# Patient Record
Sex: Female | Born: 1975 | Hispanic: Yes | Marital: Married | Smoking: Never smoker
Health system: Southern US, Community
[De-identification: ages and names within clinical notes are randomized; demographics above are authoritative.]

## PROBLEM LIST (undated history)

## (undated) DIAGNOSIS — O24419 Gestational diabetes mellitus in pregnancy, unspecified control: Secondary | ICD-10-CM

---

## 2006-02-08 ENCOUNTER — Emergency Department: Payer: Self-pay | Admitting: Emergency Medicine

## 2006-05-01 ENCOUNTER — Emergency Department: Payer: Self-pay | Admitting: Emergency Medicine

## 2010-01-17 ENCOUNTER — Ambulatory Visit: Payer: Self-pay | Admitting: Family Medicine

## 2010-01-22 ENCOUNTER — Ambulatory Visit: Payer: Self-pay | Admitting: Family Medicine

## 2010-02-19 ENCOUNTER — Ambulatory Visit: Payer: Self-pay | Admitting: Family Medicine

## 2010-05-24 ENCOUNTER — Ambulatory Visit: Payer: Self-pay | Admitting: Family Medicine

## 2010-06-10 ENCOUNTER — Inpatient Hospital Stay: Payer: Self-pay | Admitting: Obstetrics and Gynecology

## 2010-07-22 ENCOUNTER — Ambulatory Visit: Payer: Self-pay | Admitting: Family Medicine

## 2010-08-20 ENCOUNTER — Ambulatory Visit: Payer: Self-pay | Admitting: Family Medicine

## 2014-04-21 NOTE — L&D Delivery Note (Signed)
VAGINAL DELIVERY NOTE:  Date of Delivery: 10/25/2014 Primary OB: [facility]  Gestational Age/EDD: 11/10/14. Antepartum complications: gestational diabetes Attending Physician:Jourdan Maldonado  Delivery Type: spontaneous vaginal delivery  Anesthesia: none Laceration: none Episiotomy: none Placenta: spontaneous Intrapartum complications: rapid delivery second stage  Estimated Blood Loss: 150cc GBS: negative Procedure Details: pt progressed from 5 cm to pushing within 1 hour . Bed delivery be me .   Baby: Liveborn female, Apgars 8/9, weight 2940 #, 6/7 oz, baby named    .

## 2014-05-23 LAB — OB RESULTS CONSOLE GC/CHLAMYDIA
Chlamydia: NEGATIVE
Gonorrhea: NEGATIVE

## 2014-05-23 LAB — OB RESULTS CONSOLE ABO/RH: RH Type: POSITIVE

## 2014-05-23 LAB — OB RESULTS CONSOLE HGB/HCT, BLOOD
HCT: 38 %
Hemoglobin: 12.2 g/dL

## 2014-05-23 LAB — OB RESULTS CONSOLE RUBELLA ANTIBODY, IGM: Rubella: IMMUNE

## 2014-05-23 LAB — OB RESULTS CONSOLE HIV ANTIBODY (ROUTINE TESTING): HIV: NONREACTIVE

## 2014-05-23 LAB — OB RESULTS CONSOLE RPR: RPR: NONREACTIVE

## 2014-05-23 LAB — OB RESULTS CONSOLE HEPATITIS B SURFACE ANTIGEN: HEP B S AG: NEGATIVE

## 2014-05-23 LAB — OB RESULTS CONSOLE ANTIBODY SCREEN: Antibody Screen: NEGATIVE

## 2014-05-23 LAB — OB RESULTS CONSOLE VARICELLA ZOSTER ANTIBODY, IGG: VARICELLA IGG: IMMUNE

## 2014-06-14 ENCOUNTER — Ambulatory Visit: Payer: Self-pay | Admitting: Physician Assistant

## 2014-06-19 ENCOUNTER — Ambulatory Visit: Payer: Self-pay | Admitting: Physician Assistant

## 2014-06-20 ENCOUNTER — Ambulatory Visit
Admit: 2014-06-20 | Disposition: A | Discharge status: Home / Self Care | Payer: Self-pay | Attending: Physician Assistant | Admitting: Physician Assistant

## 2014-07-21 ENCOUNTER — Ambulatory Visit
Admit: 2014-07-21 | Disposition: A | Discharge status: Home / Self Care | Payer: Self-pay | Attending: Physician Assistant | Admitting: Physician Assistant

## 2014-10-14 LAB — OB RESULTS CONSOLE GBS: GBS: NEGATIVE

## 2014-10-25 ENCOUNTER — Encounter: Payer: Self-pay | Admitting: *Deleted

## 2014-10-25 ENCOUNTER — Inpatient Hospital Stay
Admission: EM | Admit: 2014-10-25 | Discharge: 2014-10-27 | DRG: 775 | Disposition: A | Discharge status: Home / Self Care | Payer: Medicaid Other | Attending: Obstetrics and Gynecology | Admitting: Obstetrics and Gynecology

## 2014-10-25 DIAGNOSIS — O24410 Gestational diabetes mellitus in pregnancy, diet controlled: Secondary | ICD-10-CM | POA: Diagnosis present

## 2014-10-25 DIAGNOSIS — R109 Unspecified abdominal pain: Secondary | ICD-10-CM | POA: Diagnosis present

## 2014-10-25 DIAGNOSIS — O24420 Gestational diabetes mellitus in childbirth, diet controlled: Secondary | ICD-10-CM | POA: Diagnosis present

## 2014-10-25 DIAGNOSIS — Z3A39 39 weeks gestation of pregnancy: Secondary | ICD-10-CM | POA: Diagnosis present

## 2014-10-25 HISTORY — DX: Gestational diabetes mellitus in pregnancy, unspecified control: O24.419

## 2014-10-25 LAB — TYPE AND SCREEN
ABO/RH(D): O POS
Antibody Screen: NEGATIVE

## 2014-10-25 LAB — CBC
HEMATOCRIT: 41.1 % (ref 35.0–47.0)
HEMOGLOBIN: 13.1 g/dL (ref 12.0–16.0)
MCH: 24.5 pg — ABNORMAL LOW (ref 26.0–34.0)
MCHC: 31.9 g/dL — AB (ref 32.0–36.0)
MCV: 76.8 fL — AB (ref 80.0–100.0)
Platelets: 228 10*3/uL (ref 150–440)
RBC: 5.35 MIL/uL — ABNORMAL HIGH (ref 3.80–5.20)
RDW: 16 % — AB (ref 11.5–14.5)
WBC: 14.7 10*3/uL — ABNORMAL HIGH (ref 3.6–11.0)

## 2014-10-25 MED ORDER — OXYTOCIN 40 UNITS IN LACTATED RINGERS INFUSION - SIMPLE MED
INTRAVENOUS | Status: AC
  Administered 2014-10-26: 01:00:00 via INTRAVENOUS
  Filled 2014-10-25: qty 1000

## 2014-10-25 MED ORDER — OXYCODONE-ACETAMINOPHEN 5-325 MG PO TABS: 1.0000 | ORAL_TABLET | ORAL | Status: DC | PRN

## 2014-10-25 MED ORDER — OXYCODONE-ACETAMINOPHEN 5-325 MG PO TABS: 2.0000 | ORAL_TABLET | ORAL | Status: DC | PRN

## 2014-10-25 MED ORDER — CITRIC ACID-SODIUM CITRATE 334-500 MG/5ML PO SOLN: 30.0000 mL | ORAL | Status: DC | PRN

## 2014-10-25 MED ORDER — LIDOCAINE HCL (PF) 1 % IJ SOLN
30.0000 mL | INTRAMUSCULAR | Status: DC | PRN
  Filled 2014-10-25: qty 30

## 2014-10-25 MED ORDER — IBUPROFEN 600 MG PO TABS
ORAL_TABLET | ORAL | Status: AC
  Administered 2014-10-25: 600 mg via ORAL
  Filled 2014-10-25: qty 1

## 2014-10-25 MED ORDER — OXYTOCIN 10 UNIT/ML IJ SOLN
INTRAMUSCULAR | Status: AC
  Filled 2014-10-25: qty 2

## 2014-10-25 MED ORDER — ACETAMINOPHEN 325 MG PO TABS: 650.0000 mg | ORAL_TABLET | ORAL | Status: DC | PRN

## 2014-10-25 MED ORDER — OXYTOCIN 40 UNITS IN LACTATED RINGERS INFUSION - SIMPLE MED
62.5000 mL/h | INTRAVENOUS | Status: DC
  Administered 2014-10-25: 62.5 mL/h via INTRAVENOUS

## 2014-10-25 MED ORDER — LACTATED RINGERS IV SOLN: 500.0000 mL | INTRAVENOUS | Status: DC | PRN

## 2014-10-25 MED ORDER — OXYTOCIN BOLUS FROM INFUSION: 500.0000 mL | INTRAVENOUS | Status: DC

## 2014-10-25 MED ORDER — ONDANSETRON HCL 4 MG/2ML IJ SOLN
4.0000 mg | Freq: Four times a day (QID) | INTRAMUSCULAR | Status: DC | PRN
Start: 2014-10-25 — End: 2014-10-26

## 2014-10-25 MED ORDER — LACTATED RINGERS IV SOLN: INTRAVENOUS | Status: DC

## 2014-10-25 NOTE — H&P (Signed)
Tanya Hartman is a 39 y.o. female presenting for regular ctx . Maternal Medical History:  Reason for admission: Contractions.   Contractions: Onset was 6-12 hours ago.   Frequency: regular.    Fetal activity: Perceived fetal activity is normal.    Prenatal Complications - Diabetes: Diabetes is managed by diet.      OB History    Gravida Para Term Preterm AB TAB SAB Ectopic Multiple Living   6 3 3  0 1 0 1 0 0 3      Obstetric Comments   All Vaginal births at term. No complications with pregnancy, delivery or postpartum for Mom or babies as per pt.  Weights, 6lbs, 8 lbs, 9 lbs. Last appt stated baby this time about 8 lbs. Last labor approx 8-9 hours.IV pain medical management with other  deliveries. Pt reports she would like to try IV pain med. Stases with last 2 and this pregnancy she has been dx with Gestational diabetes- controlled with diet. Tests CBG 4x times a day- states normal results     Past Medical History  Diagnosis Date  . Gestational diabetes     with this preg and last 2, diet controlled   History reviewed. No pertinent past surgical history. Family History: family history is not on file. Social History:  reports that she has never smoked. She has never used smokeless tobacco. She reports that she does not drink alcohol or use illicit drugs.   Prenatal Transfer Tool  Maternal Diabetes: No Genetic Screening: Declined Maternal Ultrasounds/Referrals: Normal Fetal Ultrasounds or other Referrals:  None Maternal Substance Abuse:  Significant Maternal Medications:  None Significant Maternal Lab Results:  None Other Comments:  None  ROS  Dilation: 5 Effacement (%): 100 Station: -1 Exam by:: Orie FishermanM Taylor RN There were no vitals taken for this visit. Exam Physical Exam  Lungs cta  CV rrr without murmur adb gravid  Prenatal labs: ABO, Rh: O/Positive/-- (02/02 0000) Antibody: Negative (02/02 0000) Rubella: Immune (02/02 0000) RPR: Nonreactive (02/02  0000)  HBsAg: Negative (02/02 0000)  HIV: Non-reactive (02/02 0000)  GBS: Negative (06/25 0000)   Assessment/Plan: Labor  Anticipate svd   SCHERMERHORN,THOMAS 10/25/2014, 9:34 PM

## 2014-10-26 LAB — CBC
HCT: 38.3 % (ref 35.0–47.0)
Hemoglobin: 12 g/dL (ref 12.0–16.0)
MCH: 24 pg — AB (ref 26.0–34.0)
MCHC: 31.5 g/dL — AB (ref 32.0–36.0)
MCV: 76.4 fL — ABNORMAL LOW (ref 80.0–100.0)
Platelets: 210 10*3/uL (ref 150–440)
RBC: 5.01 MIL/uL (ref 3.80–5.20)
RDW: 16.3 % — AB (ref 11.5–14.5)
WBC: 15.7 10*3/uL — ABNORMAL HIGH (ref 3.6–11.0)

## 2014-10-26 LAB — GLUCOSE, CAPILLARY: GLUCOSE-CAPILLARY: 86 mg/dL (ref 65–99)

## 2014-10-26 MED ORDER — DIPHENHYDRAMINE HCL 25 MG PO CAPS: 25.0000 mg | ORAL_CAPSULE | Freq: Four times a day (QID) | ORAL | Status: DC | PRN

## 2014-10-26 MED ORDER — LANOLIN HYDROUS EX OINT: TOPICAL_OINTMENT | CUTANEOUS | Status: DC | PRN

## 2014-10-26 MED ORDER — DIBUCAINE 1 % RE OINT: 1.0000 "application " | TOPICAL_OINTMENT | RECTAL | Status: DC | PRN

## 2014-10-26 MED ORDER — OXYTOCIN 10 UNIT/ML IJ SOLN
10.0000 [IU] | Freq: Once | INTRAMUSCULAR | Status: AC
  Administered 2014-10-25: 10 [IU] via INTRAMUSCULAR

## 2014-10-26 MED ORDER — SIMETHICONE 80 MG PO CHEW: 80.0000 mg | CHEWABLE_TABLET | ORAL | Status: DC | PRN

## 2014-10-26 MED ORDER — MAGNESIUM HYDROXIDE 400 MG/5ML PO SUSP: 30.0000 mL | ORAL | Status: DC | PRN

## 2014-10-26 MED ORDER — ONDANSETRON HCL 4 MG/2ML IJ SOLN: 4.0000 mg | INTRAMUSCULAR | Status: DC | PRN

## 2014-10-26 MED ORDER — PRENATAL MULTIVITAMIN CH: 1.0000 | ORAL_TABLET | Freq: Every day | ORAL | Status: DC

## 2014-10-26 MED ORDER — ZOLPIDEM TARTRATE 5 MG PO TABS: 5.0000 mg | ORAL_TABLET | Freq: Every evening | ORAL | Status: DC | PRN

## 2014-10-26 MED ORDER — FERROUS SULFATE 325 (65 FE) MG PO TABS
325.0000 mg | ORAL_TABLET | Freq: Two times a day (BID) | ORAL | Status: DC
  Administered 2014-10-26 – 2014-10-27 (×3): 325 mg via ORAL
  Filled 2014-10-26 (×3): qty 1

## 2014-10-26 MED ORDER — BENZOCAINE-MENTHOL 20-0.5 % EX AERO: 1.0000 "application " | INHALATION_SPRAY | CUTANEOUS | Status: DC | PRN

## 2014-10-26 MED ORDER — ACETAMINOPHEN 325 MG PO TABS: 650.0000 mg | ORAL_TABLET | ORAL | Status: DC | PRN

## 2014-10-26 MED ORDER — SENNOSIDES-DOCUSATE SODIUM 8.6-50 MG PO TABS
2.0000 | ORAL_TABLET | ORAL | Status: DC
  Administered 2014-10-26 – 2014-10-27 (×2): 2 via ORAL
  Filled 2014-10-26 (×2): qty 2

## 2014-10-26 MED ORDER — WITCH HAZEL-GLYCERIN EX PADS: 1.0000 "application " | MEDICATED_PAD | CUTANEOUS | Status: DC | PRN

## 2014-10-26 MED ORDER — IBUPROFEN 600 MG PO TABS
600.0000 mg | ORAL_TABLET | Freq: Four times a day (QID) | ORAL | Status: DC
  Administered 2014-10-26 – 2014-10-27 (×6): 600 mg via ORAL
  Filled 2014-10-26 (×6): qty 1

## 2014-10-26 MED ORDER — OXYCODONE-ACETAMINOPHEN 5-325 MG PO TABS: 1.0000 | ORAL_TABLET | ORAL | Status: DC | PRN

## 2014-10-26 MED ORDER — ONDANSETRON HCL 4 MG PO TABS: 4.0000 mg | ORAL_TABLET | ORAL | Status: DC | PRN

## 2014-10-26 MED ORDER — MEASLES, MUMPS & RUBELLA VAC ~~LOC~~ INJ: 0.5000 mL | INJECTION | Freq: Once | SUBCUTANEOUS | Status: DC

## 2014-10-26 NOTE — Progress Notes (Signed)
Post Partum Day 1 Subjective: no complaints  Objective: Blood pressure 143/84, pulse 75, temperature 98.4 F (36.9 C), temperature source Oral, resp. rate 18, height 5\' 7"  (1.702 m), weight 160 lb (72.576 kg), SpO2 100 %, unknown if currently breastfeeding.  Physical Exam:  General: alert and cooperative Lochia: appropriate Uterine Fundus: firm Incision: none DVT Evaluation: No evidence of DVT seen on physical exam.  CV RRR Lungs CTA   Recent Labs  10/25/14 2204 10/26/14 0453  HGB 13.1 12.0  HCT 41.1 38.3    Assessment/Plan: Plan for discharge tomorrow   LOS: 1 day   Param Capri 10/26/2014, 8:55 AM

## 2014-10-27 LAB — RPR: RPR Ser Ql: NONREACTIVE

## 2014-10-27 MED ORDER — NORETHINDRONE 0.35 MG PO TABS: 1.0000 | ORAL_TABLET | Freq: Every day | ORAL | Status: DC

## 2014-10-27 MED ORDER — IBUPROFEN 600 MG PO TABS: 600.0000 mg | ORAL_TABLET | Freq: Four times a day (QID) | ORAL | Status: AC

## 2014-10-27 NOTE — Discharge Summary (Signed)
Obstetric Discharge Summary Reason for Admission: onset of labor Prenatal Procedures: none Intrapartum Procedures: spontaneous vaginal delivery Postpartum Procedures: none Complications-Operative and Postpartum: none HEMOGLOBIN  Date Value Ref Range Status  10/26/2014 12.0 12.0 - 16.0 g/dL Final  36/64/403402/05/2014 74.212.2 g/dL Final   HCT  Date Value Ref Range Status  10/26/2014 38.3 35.0 - 47.0 % Final  05/23/2014 38 % Final    Physical Exam:  General: alert and cooperative Lochia: appropriate Uterine Fundus: firm Incision: none DVT Evaluation: No evidence of DVT seen on physical exam. cv RRR , Lungs CTA Discharge Diagnoses: Term Pregnancy-delivered  Discharge Information: Date: 10/27/2014 Activity: pelvic rest Diet: routine Medications: Ibuprofen and micronor Condition: stable Instructions: refer to practice specific booklet Discharge to: home Follow-up Information    Follow up with [facility] In 6 weeks.   Specialty:  General Practice   Why:  postpartum care   Contact information:   [address] [place] [address] [phone]       Newborn Data: Live born female  Birth Weight: 6 lb 7.7 oz (2940 g) APGAR: 8, 9  Home with mother.  SCHERMERHORN,THOMAS 10/27/2014, 9:56 AM

## 2016-09-07 IMAGING — US US OB US >=[ID] SNGL FETUS
1 series · 13 of 28 positions shown · non-contrast
Comparison: none

CLINICAL DATA: Fetal anatomy evaluation

EXAM:
ULTRASOUND OB >=CN90R SINGLE FETUS

[Series 1: us ob us >=(id) sngl fetus · 0.26mm/px · 13 of 83 slices shown]
[im 4/83]
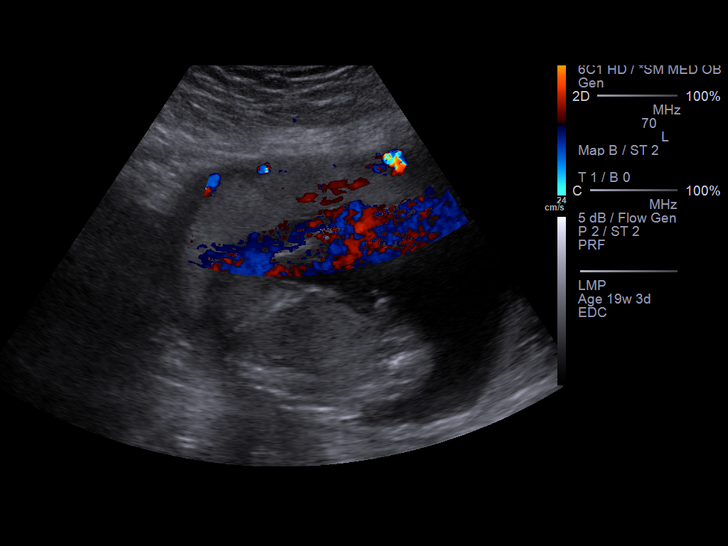
[im 10/83]
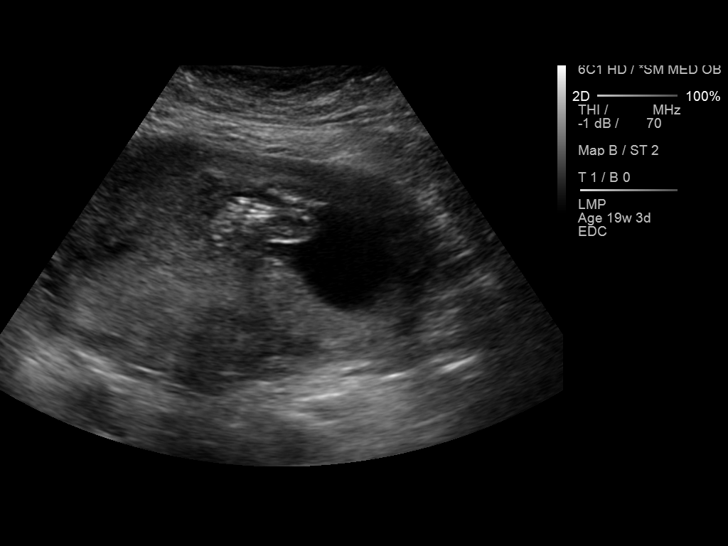
[im 16/83]
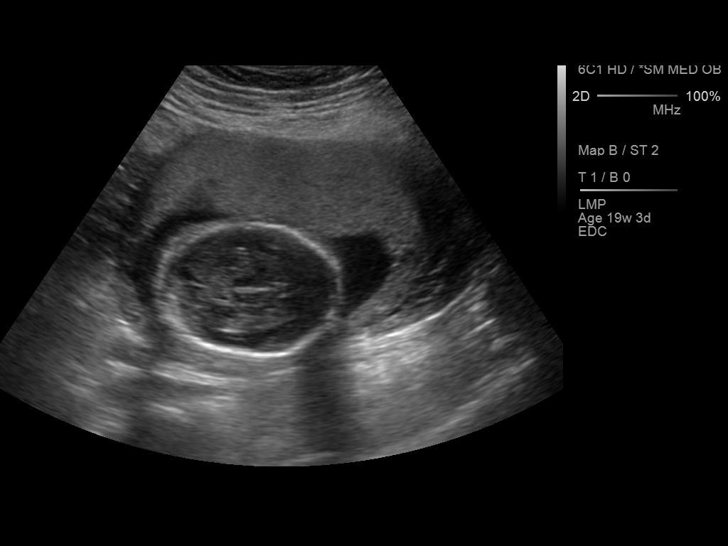
[im 22/83]
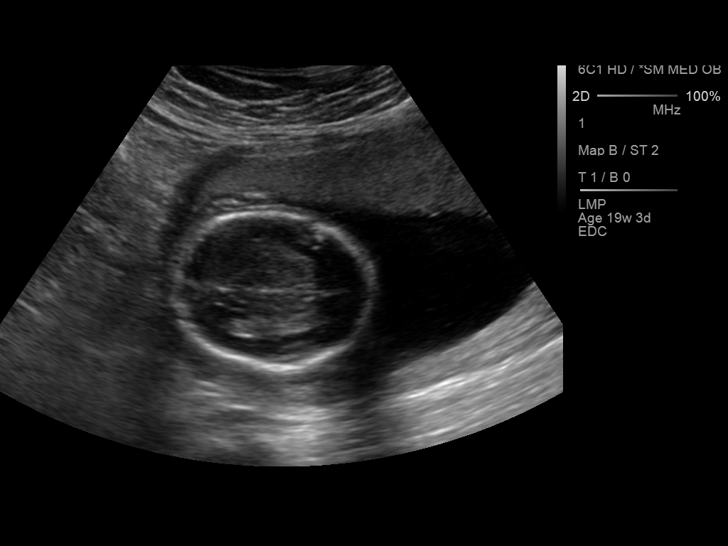
[im 28/83]
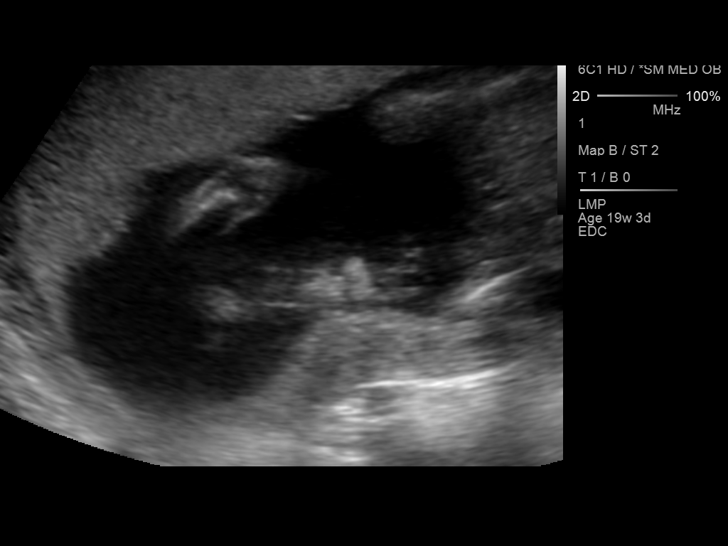
[im 34/83]
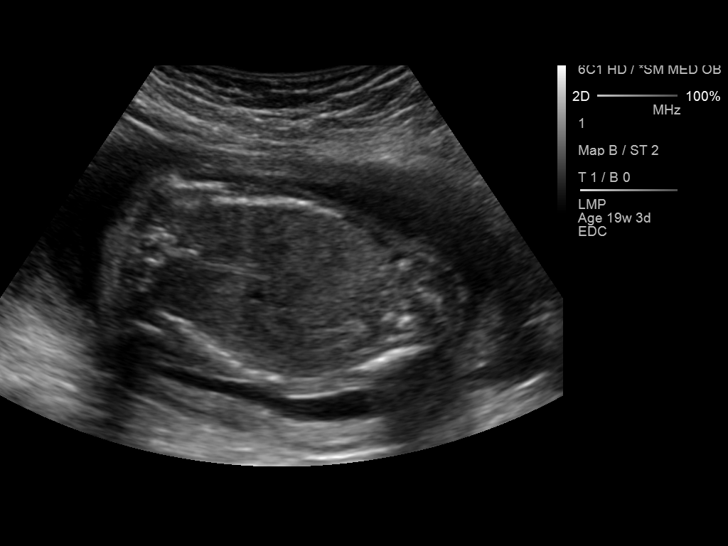
[im 43/83]
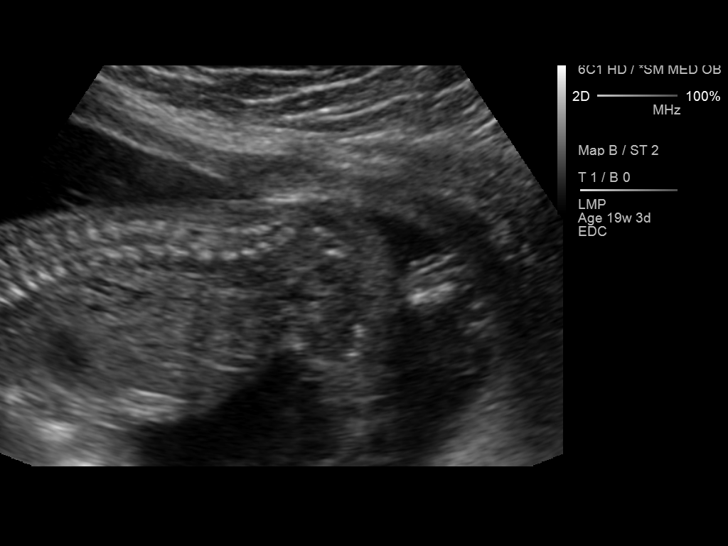
[im 49/83]
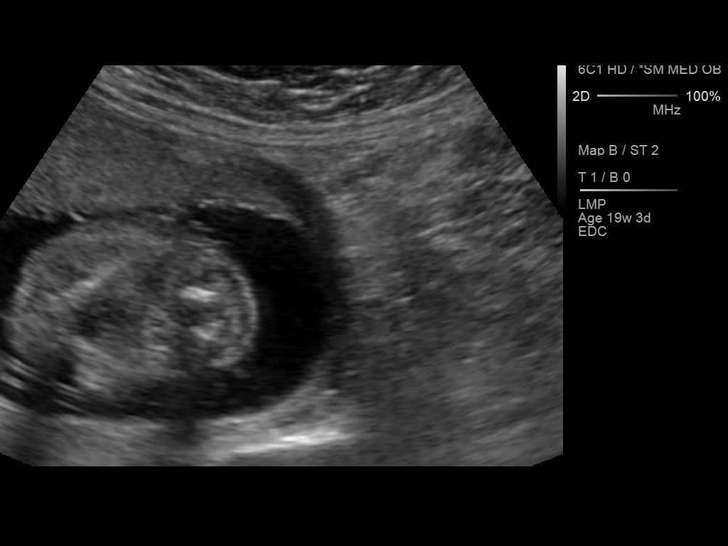
[im 55/83]
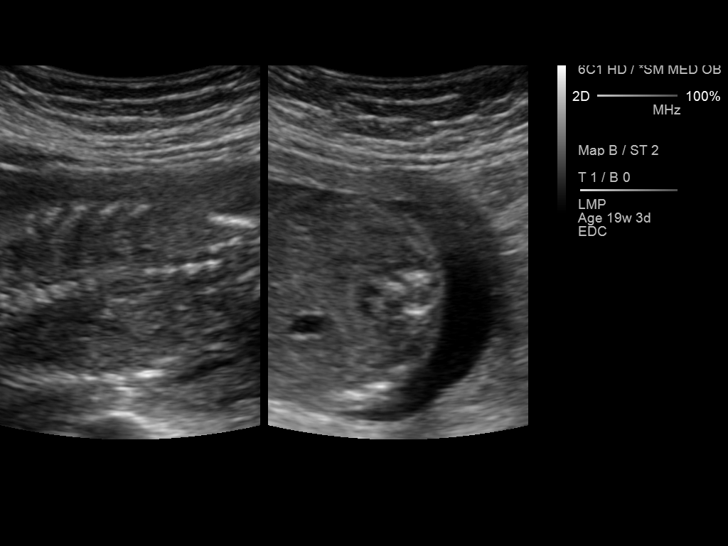
[im 61/83]
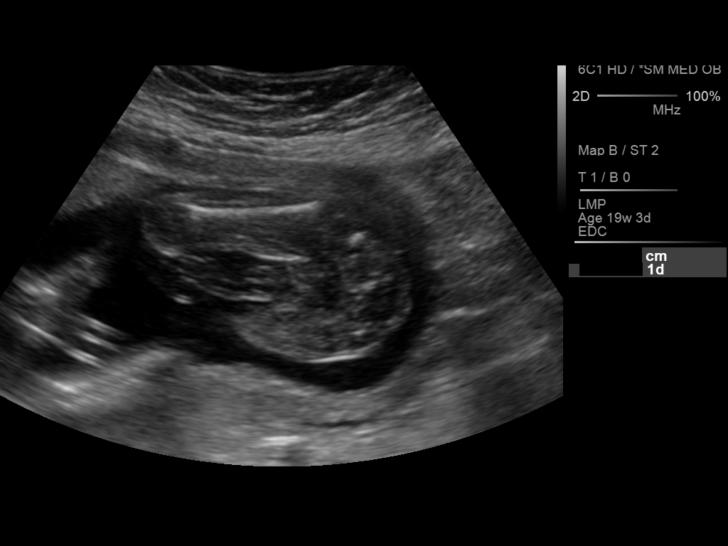
[im 67/83]
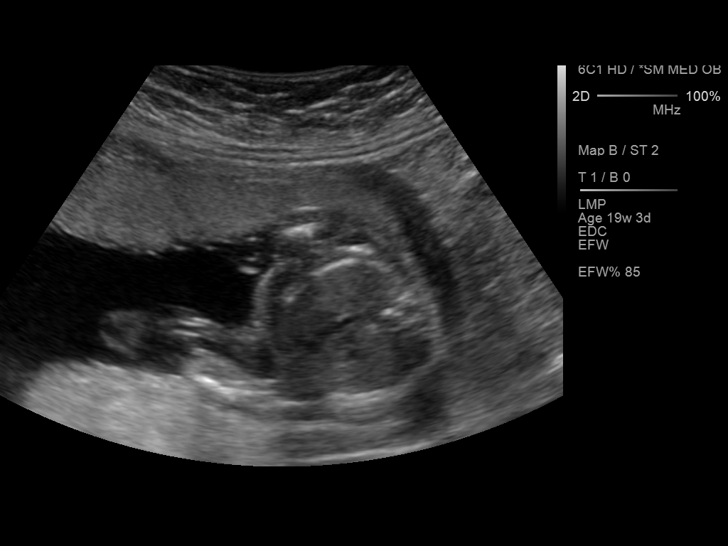
[im 73/83]
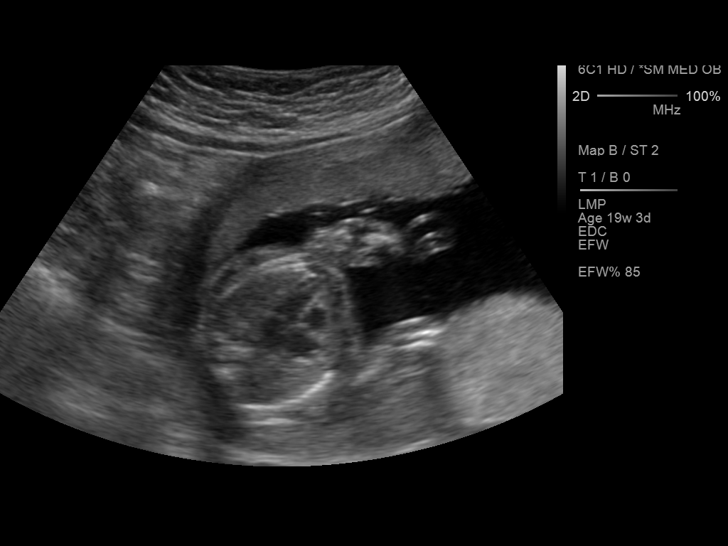
[im 79/83]
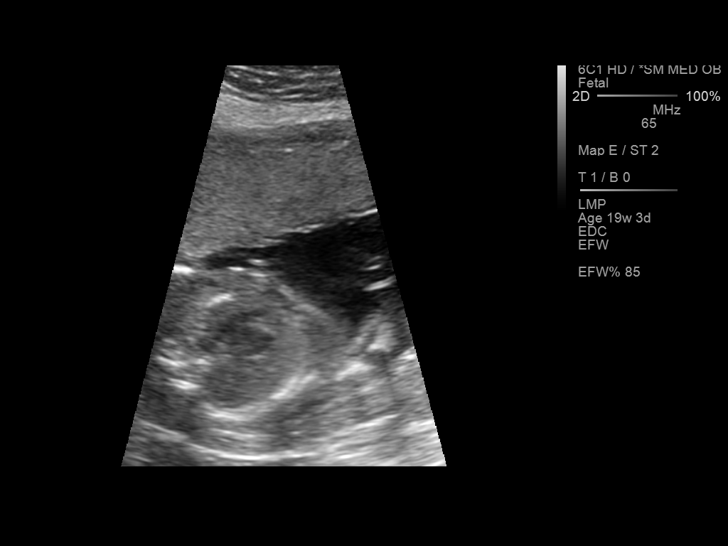

[13 of 28 positions shown; findings below may reference images not displayed]

FINDINGS: Number of Fetuses: 1

Heart Rate:  136 bpm

Movement: Yes

Presentation: Transverse

Previa: No

Placental Location: Anterior

Amniotic Fluid (Subjective): Normal

Amniotic Fluid (Objective):

Vertical pocket 5.6cm

FETAL BIOMETRY

BPD:  4.56cm 19w 5d

HC:    17.36cm  19w   6d

AC:   14.9cm  20w   1d

FL:   3.28cm  20w   2d

Current Mean GA: 20w 1d              US EDC: 11/05/2014

Estimated Fetal Weight:  336g    85%ile

FETAL ANATOMY

Lateral Ventricles: Appears normal

Thalami/CSP: Appears normal

Posterior Fossa:  Appears normal

Nuchal Region: Appears normal    NFT= 2.6mm

Upper Lip: Appears normal

Spine: Appears normal

4 Chamber Heart on Left: Appears normal

LVOT: Not well-visualized

RVOT: Not well visualized

Stomach on Left: Appears normal

3 Vessel Cord: Appears normal

Cord Insertion site: Appears normal

Kidneys: Appears normal

Bladder: Appears normal

Extremities: Appears normal

Sex: Male

Technically difficult due to: Fetal position

Maternal Findings:

Cervix:  Closed.  5.75 cm in length.
IMPRESSION: Single live intrauterine pregnancy as detailed above.

## 2018-10-08 ENCOUNTER — Other Ambulatory Visit: Payer: Self-pay

## 2018-10-08 ENCOUNTER — Encounter: Payer: Self-pay | Admitting: Emergency Medicine

## 2018-10-08 ENCOUNTER — Emergency Department
Admission: EM | Admit: 2018-10-08 | Discharge: 2018-10-08 | Disposition: A | Discharge status: Home / Self Care | Payer: HRSA Program | Attending: Emergency Medicine | Admitting: Emergency Medicine

## 2018-10-08 DIAGNOSIS — Z20828 Contact with and (suspected) exposure to other viral communicable diseases: Secondary | ICD-10-CM | POA: Diagnosis not present

## 2018-10-08 DIAGNOSIS — Z20822 Contact with and (suspected) exposure to covid-19: Secondary | ICD-10-CM

## 2018-10-08 NOTE — ED Triage Notes (Signed)
Says here to be tested for covid as she has been exposed.

## 2018-10-08 NOTE — ED Provider Notes (Signed)
[facility] Emergency Department Provider Note   ____________________________________________    I have reviewed the triage vital signs and the nursing notes.   HISTORY  Chief Complaint COVID test  Interpreter used   HPI Tanya Hartman is a 43 y.o. female here for COVID testing because she was exposed to someone that had COVID positive.  At this time she has no symptoms.  She feels well.  Past Medical History:  Diagnosis Date  . Gestational diabetes    with this preg and last 2, diet controlled    Patient Active Problem List   Diagnosis Date Noted  . Abdominal pain 10/25/2014    History reviewed. No pertinent surgical history.  Prior to Admission medications   Medication Sig Start Date End Date Taking? Authorizing Provider  ibuprofen (ADVIL,MOTRIN) 600 MG tablet Take 1 tablet (600 mg total) by mouth every 6 (six) hours. 10/27/14   Schermerhorn, Ihor Austinhomas J, MD  norethindrone (ORTHO MICRONOR) 0.35 MG tablet Take 1 tablet (0.35 mg total) by mouth daily. 10/27/14   Schermerhorn, Ihor Austinhomas J, MD  Prenatal Vit-Fe Fumarate-FA (PRENATAL MULTIVITAMIN) TABS tablet Take 1 tablet by mouth daily at 12 noon.    [provider]     Allergies Patient has no known allergies.  No family history on file.  Social History Social History   Tobacco Use  . Smoking status: Never Smoker  . Smokeless tobacco: Never Used  Substance Use Topics  . Alcohol use: No  . Drug use: No    Review of Systems  Constitutional: No fever/chills  ENT: No sore throat.      Musculoskeletal: Negative for myalgias Skin: Negative for rash. Neurological: Negative for headaches     ____________________________________________   PHYSICAL EXAM:  VITAL SIGNS: ED Triage Vitals  Enc Vitals Group     BP 10/08/18 1353 (!) 146/94     Pulse Rate 10/08/18 1353 77     Resp 10/08/18 1353 16     Temp 10/08/18 1353 99 F (37.2 C)     Temp Source 10/08/18 1353  Oral     SpO2 10/08/18 1353 99 %     Weight 10/08/18 1351 65.8 kg (145 lb)     Height --      Head Circumference --      Peak Flow --      Pain Score 10/08/18 1351 0     Pain Loc --      Pain Edu? --      Excl. in GC? --      Constitutional: Alert and oriented. No acute distress. Pleasant and interactive Eyes: Conjunctivae are normal.  Head: Atraumatic. Nose: No congestion/rhinnorhea. Mouth/Throat: Mucous membranes are moist.   Cardiovascular: Normal rate, regular rhythm.  Respiratory: Normal respiratory effort.  No retractions.  Musculoskeletal: No lower extremity tenderness nor edema.   Neurologic:No gross focal neurologic deficits are appreciated.   Skin:  Skin is warm, dry and intact. No rash noted.   ____________________________________________   LABS (all labs ordered are listed, but only abnormal results are displayed)  Labs Reviewed  NOVEL CORONAVIRUS, NAA (HOSPITAL ORDER, SEND-OUT TO REF LAB)   ____________________________________________  EKG   ____________________________________________  RADIOLOGY  None ____________________________________________   PROCEDURES  Procedure(s) performed: No  Procedures   Critical Care performed: No ____________________________________________   INITIAL IMPRESSION / ASSESSMENT AND PLAN / ED COURSE  Pertinent labs & imaging results that were available during my care of the patient were reviewed by me and  considered in my medical decision making (see chart for details).  COVID swab performed, sent out to reference lab   ____________________________________________   FINAL CLINICAL IMPRESSION(S) / ED DIAGNOSES  Final diagnoses:  Exposure to Covid-19 Virus      NEW MEDICATIONS STARTED DURING THIS VISIT:  Discharge Medication List as of 10/08/2018  4:11 PM       Note:  This document was prepared using Dragon voice recognition software and may include unintentional dictation errors.   Lavonia Drafts, MD 10/08/18 [phone]

## 2018-10-09 LAB — NOVEL CORONAVIRUS, NAA (HOSP ORDER, SEND-OUT TO REF LAB; TAT 18-24 HRS): SARS-CoV-2, NAA: NOT DETECTED

## 2018-10-14 ENCOUNTER — Emergency Department: Admission: EM | Admit: 2018-10-14 | Discharge: 2018-10-14 | Discharge status: Absent without leave | Payer: Self-pay

## 2018-10-14 NOTE — ED Notes (Signed)
Interpreter request placed by this RN, pt unable to state chief complaint, however visualized in NAD at this time.

## 2018-10-14 NOTE — ED Notes (Signed)
With the interpretter ipad I determined that the patient is not seeking care, she just wants a copy of her covid 19 result for her employer.  I gave her a copy of her result and had ED pt access dismiss her from the track board.

## 2023-01-22 ENCOUNTER — Encounter: Payer: Self-pay | Admitting: Physician Assistant

## 2023-02-02 ENCOUNTER — Other Ambulatory Visit: Payer: Self-pay | Admitting: Obstetrics and Gynecology

## 2023-02-02 ENCOUNTER — Ambulatory Visit
Admission: RE | Admit: 2023-02-02 | Discharge: 2023-02-02 | Disposition: A | Discharge status: Home / Self Care | Payer: Self-pay | Source: Ambulatory Visit | Attending: Obstetrics and Gynecology | Admitting: Obstetrics and Gynecology

## 2023-02-02 ENCOUNTER — Ambulatory Visit: Payer: Self-pay | Attending: Hematology and Oncology | Admitting: Hematology and Oncology

## 2023-02-02 VITALS — BP 152/90 | Wt 144.4 lb

## 2023-02-02 DIAGNOSIS — Z1231 Encounter for screening mammogram for malignant neoplasm of breast: Secondary | ICD-10-CM | POA: Insufficient documentation

## 2023-02-02 NOTE — Patient Instructions (Signed)
Taught [name] about self breast awareness and gave educational materials to take home. Patient did not need a Pap smear today due to last Pap smear was in 05/27/2021 per patient.  Let her know BCCCP will cover Pap smears every 5 years unless has a history of abnormal Pap smears. Referred patient to the [facility] for screening mammogram. Appointment scheduled for 02/02/2023. Patient aware of appointment and will be there. Let patient know will follow up with her within the next couple weeks with results. Tanya Hartman verbalized understanding.  Pascal Lux, NP 10:58 AM

## 2023-02-02 NOTE — Progress Notes (Signed)
[name] is a 47 y.o. female who presents to [facility] clinic today with no complaints.    Pap Smear: Pap not smear completed today. Last Pap smear was 05/27/2021 at [facility] clinic and was normal. Per patient has no history of an abnormal Pap smear. Last Pap smear result is available in Epic.   Physical exam: Breasts Breasts symmetrical. No skin abnormalities bilateral breasts. No nipple retraction bilateral breasts. No nipple discharge bilateral breasts. No lymphadenopathy. No lumps palpated bilateral breasts.       Pelvic/Bimanual Pap is not indicated today    Smoking History: Patient has never smoked and was not referred to quit line.    Patient Navigation: Patient education provided. Access to services provided for patient through BCCCP program. Tanya Hartman interpreter provided. No transportation provided   Colorectal Cancer Screening: Per patient has never had colonoscopy completed No complaints today. FIT test completed 01/30/2023   Breast and Cervical Cancer Risk Assessment: Patient does not have family history of breast cancer, known genetic mutations, or radiation treatment to the chest before age 52. Patient does not have history of cervical dysplasia, immunocompromised, or DES exposure in-utero.  Risk Assessment   No risk assessment data     A: BCCCP exam without pap smear No complaints with benign exam.   P: Referred patient to the [facility] for a screening mammogram. Appointment scheduled 02/02/23.  Pascal Lux, NP 02/02/2023 10:57 AM

## 2023-02-05 ENCOUNTER — Other Ambulatory Visit: Payer: Self-pay | Admitting: Obstetrics and Gynecology

## 2023-02-05 DIAGNOSIS — R928 Other abnormal and inconclusive findings on diagnostic imaging of breast: Secondary | ICD-10-CM

## 2023-02-17 ENCOUNTER — Telehealth: Payer: Self-pay | Admitting: *Deleted

## 2023-03-02 ENCOUNTER — Ambulatory Visit
Admission: RE | Admit: 2023-03-02 | Discharge: 2023-03-02 | Disposition: A | Discharge status: Home / Self Care | Payer: Self-pay | Source: Ambulatory Visit | Attending: Obstetrics and Gynecology | Admitting: Obstetrics and Gynecology

## 2023-03-02 DIAGNOSIS — R928 Other abnormal and inconclusive findings on diagnostic imaging of breast: Secondary | ICD-10-CM | POA: Insufficient documentation

## 2023-04-03 ENCOUNTER — Other Ambulatory Visit: Payer: Self-pay

## 2023-04-03 DIAGNOSIS — R928 Other abnormal and inconclusive findings on diagnostic imaging of breast: Secondary | ICD-10-CM

## 2023-05-14 ENCOUNTER — Telehealth: Payer: Self-pay | Admitting: *Deleted

## 2023-08-31 ENCOUNTER — Other Ambulatory Visit: Payer: Self-pay

## 2023-08-31 ENCOUNTER — Inpatient Hospital Stay: Admission: RE | Admit: 2023-08-31 | Payer: Self-pay | Source: Ambulatory Visit

## 2023-09-11 ENCOUNTER — Ambulatory Visit
Admission: RE | Admit: 2023-09-11 | Discharge: 2023-09-11 | Disposition: A | Discharge status: Home / Self Care | Payer: Self-pay | Source: Ambulatory Visit | Attending: Obstetrics and Gynecology | Admitting: Obstetrics and Gynecology

## 2023-09-11 DIAGNOSIS — R928 Other abnormal and inconclusive findings on diagnostic imaging of breast: Secondary | ICD-10-CM

## 2023-09-15 ENCOUNTER — Ambulatory Visit: Payer: Self-pay | Admitting: Obstetrics and Gynecology

## 2023-09-15 DIAGNOSIS — R928 Other abnormal and inconclusive findings on diagnostic imaging of breast: Secondary | ICD-10-CM

## 2023-09-16 ENCOUNTER — Other Ambulatory Visit: Payer: Self-pay | Admitting: Obstetrics and Gynecology

## 2023-09-16 DIAGNOSIS — N6489 Other specified disorders of breast: Secondary | ICD-10-CM

## 2023-09-16 DIAGNOSIS — R928 Other abnormal and inconclusive findings on diagnostic imaging of breast: Secondary | ICD-10-CM

## 2024-03-21 NOTE — Progress Notes (Deleted)
 [name] is a 48 y.o. female who presents to [facility] clinic today with {Blank single:19197::no complaints,complaint of} ***.    Pap Smear: Pap smear not completed today. Last Pap smear was 05/27/2021 at [facility] clinic and was normal with negative HPV. Per patient has {Blank single:19197::no history,history} of an abnormal Pap smear. Last Pap smear result is available in Epic.   Physical exam: Breasts Breasts symmetrical. No skin abnormalities bilateral breasts. No nipple retraction bilateral breasts. No nipple discharge bilateral breasts. No lymphadenopathy. No lumps palpated bilateral breasts.      MS 3D DIAG MAMMO UNI LT BR (aka MM) Result Date: 09/11/2023 CLINICAL DATA:  BI-RADS 3 follow-up of a LEFT breast mass and asymmetry, initiated November 2024 EXAM: DIGITAL DIAGNOSTIC UNILATERAL LEFT MAMMOGRAM WITH TOMOSYNTHESIS AND CAD; ULTRASOUND LEFT BREAST LIMITED TECHNIQUE: Left digital diagnostic mammography and breast tomosynthesis was performed. The images were evaluated with computer-aided detection. ; Targeted ultrasound examination of the left breast was performed. COMPARISON:  Previous exam(s). ACR Breast Density Category c: The breasts are heterogeneously dense, which may obscure small masses. FINDINGS: Diagnostic images of the LEFT breast demonstrate mammographic stability of a LEFT breast asymmetry on CC view. No new suspicious findings are noted in the LEFT breast. Targeted ultrasound was performed LEFT outer breast. At 2 o'clock 5 cm from the nipple there is revisualization of an oval circumscribed mass. On today's exam, it is anechoic in appearance with posterior acoustic enhancement. It measures 9 x 9 x 4 mm, previously 6 x 7 x 3 mm. This is consistent with a benign cyst. IMPRESSION: 1. Stable probably benign LEFT breast asymmetry. Recommend follow-up diagnostic mammogram in 6 months. This will establish 1 year of definitive stability from baseline mammogram. 2. On  today's exam, the oval mass at 2 o'clock is most consistent with a benign cyst. RECOMMENDATION: Recommend bilateral diagnostic mammogram (with RIGHT and LEFT breast ultrasound if deemed necessary) in 6 months. Patient is due for contralateral screening at this point in time. I have discussed the findings and recommendations with the patient with the assistance of a Spanish interpreter. If applicable, a reminder letter will be sent to the patient regarding the next appointment. BI-RADS CATEGORY  3: Probably benign. Electronically Signed   By: Corean Salter M.D.   On: 09/11/2023 15:39   MS 3D DIAG MAMMO UNI LT BR (aka MM) Result Date: 03/02/2023 CLINICAL DATA:  LEFT trach all back EXAM: DIGITAL DIAGNOSTIC UNILATERAL LEFT MAMMOGRAM WITH TOMOSYNTHESIS AND CAD; ULTRASOUND LEFT BREAST LIMITED TECHNIQUE: Left digital diagnostic mammography and breast tomosynthesis was performed. The images were evaluated with computer-aided detection. ; Targeted ultrasound examination of the left breast was performed. COMPARISON:  Previous exam(s). ACR Breast Density Category c: The breasts are heterogeneously dense, which may obscure small masses. FINDINGS: Spot compression tomosynthesis views demonstrate effacement of the LEFT breast asymmetry on CC view. No additional suspicious findings are noted. On physical exam, no suspicious mass is appreciated. Targeted ultrasound was performed of the LEFT outer breast. At 2 o'clock 5 cm from the nipple, there is an oval circumscribed near anechoic mass with posterior acoustic enhancement. There are internal low level echoes. This measures 6 x 7 x 3 mm. Adjacent there is an area of favored benign tissue with shadowing from a Cooper's ligament. IMPRESSION: 1. There is a probably benign mass with adjacent favored benign breast tissue in the LEFT breast. Option for short-term follow-up versus definitive characterization with ultrasound-guided aspiration with potential conversion to biopsy  was discussed with  patient. Patient would prefer to proceed with short-term follow-up at this point in time. As such, recommend follow-up diagnostic mammogram and ultrasound in 6 months. This will establish 6 months of definitive stability from baseline mammogram. RECOMMENDATION: LEFT diagnostic mammogram and ultrasound in 6 months. I have discussed the findings and recommendations with the patient with the assistance of a Spanish interpreter. If applicable, a reminder letter will be sent to the patient regarding the next appointment. BI-RADS CATEGORY  3: Probably benign. Electronically Signed   By: Corean Salter M.D.   On: 03/02/2023 16:23   MS 3D SCR MAMMO BILAT BR (aka MM) Result Date: 02/04/2023 CLINICAL DATA:  Screening. EXAM: DIGITAL SCREENING BILATERAL MAMMOGRAM WITH TOMOSYNTHESIS AND CAD TECHNIQUE: Bilateral screening digital craniocaudal and mediolateral oblique mammograms were obtained. Bilateral screening digital breast tomosynthesis was performed. The images were evaluated with computer-aided detection. COMPARISON:  None available. ACR Breast Density Category c: The breasts are heterogeneously dense, which may obscure small masses. FINDINGS: In the left breast, a possible asymmetry warrants further evaluation. In the right breast, no findings suspicious for malignancy. IMPRESSION: Further evaluation is suggested for possible asymmetry in the left breast. RECOMMENDATION: Diagnostic mammogram and possibly ultrasound of the left breast. (Code:[id number]) The patient will be contacted regarding the findings, and additional imaging will be scheduled. BI-RADS CATEGORY  0: Incomplete: Need additional imaging evaluation. Electronically Signed   By: Delon Music M.D.   On: 02/04/2023 12:24    Pelvic/Bimanual Pap is not indicated today per BCCCP guidelines.   Smoking History: Patient has {Blank single:19197::never smoked,is a former smoker,is a current smoker at *** packs per day}  ***referred to quit line.    Patient Navigation: Patient education provided. Access to services provided for patient through COMCAST program. Spanish interpreter Damon Pierce from [facility] provided.   Colorectal Cancer Screening: Per patient {Blank single:19197::has had colonoscopy completed on ***,has never had colonoscopy completed} No complaints today.    Breast and Cervical Cancer Risk Assessment: Patient {Blank single:19197::has,does not have} family history of breast cancer, known genetic mutations, or radiation treatment to the chest before age 81. Patient {Blank single:19197::has,does not have} history of cervical dysplasia, immunocompromised, or DES exposure in-utero.  Risk Scores as of Encounter on 03/22/2024     [place] as of 02/02/2023           5-year 0.67%   Lifetime 7.9%   This patient is [place] but has no documented birth country, so the [place] model used data from [place] patients to calculate their risk score. Document a birth country in the Demographics activity for a more accurate score.         Last calculated by Rogerio Tempie SQUIBB, LPN on 89/85/7975 at 11:28 AM        A: BCCCP exam without pap smear Complaint of ***  P: Referred patient to the [facility] for a diagnostic mammogram. Appointment scheduled Wednesday, March 23, 2024 at 1040.  Driscilla Wanda SQUIBB, RN 03/21/2024 9:08 AM

## 2024-03-22 ENCOUNTER — Ambulatory Visit: Payer: Self-pay

## 2024-03-23 ENCOUNTER — Other Ambulatory Visit: Payer: Self-pay

## 2024-04-01 ENCOUNTER — Ambulatory Visit: Payer: Self-pay

## 2024-04-06 ENCOUNTER — Other Ambulatory Visit: Payer: Self-pay

## 2024-04-07 ENCOUNTER — Ambulatory Visit: Payer: Self-pay | Admitting: *Deleted

## 2024-04-07 VITALS — Wt 136.0 lb

## 2024-04-07 DIAGNOSIS — N6315 Unspecified lump in the right breast, overlapping quadrants: Secondary | ICD-10-CM

## 2024-04-07 DIAGNOSIS — Z1211 Encounter for screening for malignant neoplasm of colon: Secondary | ICD-10-CM

## 2024-04-07 DIAGNOSIS — Z1239 Encounter for other screening for malignant neoplasm of breast: Secondary | ICD-10-CM

## 2024-04-07 NOTE — Progress Notes (Signed)
 Ms. Brizeida Mcmurry is a 48 y.o. female who presents to [facility] clinic today with no complaints. Patient referred to [facility] due to her last left breast diagnostic mammogram was completed 09/11/2023 that was probably benign that a bilateral diagnostic mammogram is recommended for follow up in 15-months.   Pap Smear: Pap smear not completed today. Last Pap smear was 05/27/2021 at [facility] clinic and was normal with negative HPV. Per patient has no history of an abnormal Pap smear. Last Pap smear result is available in Epic.   Physical exam: Breasts Breasts symmetrical. No skin abnormalities bilateral breasts. No nipple retraction right breast. Left nipple inverted that per patient is normal for her. No nipple discharge bilateral breasts. No lymphadenopathy. No lumps palpated left breast. Palpated a lump within the right breast at 9 o'clock 10 cm from the nipple. No complaints of pain or tenderness on exam.    MS 3D DIAG MAMMO UNI LT BR (aka MM) Result Date: 09/11/2023 CLINICAL DATA:  BI-RADS 3 follow-up of a LEFT breast mass and asymmetry, initiated November 2024 EXAM: DIGITAL DIAGNOSTIC UNILATERAL LEFT MAMMOGRAM WITH TOMOSYNTHESIS AND CAD; ULTRASOUND LEFT BREAST LIMITED TECHNIQUE: Left digital diagnostic mammography and breast tomosynthesis was performed. The images were evaluated with computer-aided detection. ; Targeted ultrasound examination of the left breast was performed. COMPARISON:  Previous exam(s). ACR Breast Density Category c: The breasts are heterogeneously dense, which may obscure small masses. FINDINGS: Diagnostic images of the LEFT breast demonstrate mammographic stability of a LEFT breast asymmetry on CC view. No new suspicious findings are noted in the LEFT breast. Targeted ultrasound was performed LEFT outer breast. At 2 o'clock 5 cm from the nipple there is revisualization of an oval circumscribed mass. On today's exam, it is anechoic in appearance with posterior acoustic enhancement.  It measures 9 x 9 x 4 mm, previously 6 x 7 x 3 mm. This is consistent with a benign cyst. IMPRESSION: 1. Stable probably benign LEFT breast asymmetry. Recommend follow-up diagnostic mammogram in 6 months. This will establish 1 year of definitive stability from baseline mammogram. 2. On today's exam, the oval mass at 2 o'clock is most consistent with a benign cyst. RECOMMENDATION: Recommend bilateral diagnostic mammogram (with RIGHT and LEFT breast ultrasound if deemed necessary) in 6 months. Patient is due for contralateral screening at this point in time. I have discussed the findings and recommendations with the patient with the assistance of a Spanish interpreter. If applicable, a reminder letter will be sent to the patient regarding the next appointment. BI-RADS CATEGORY  3: Probably benign. Electronically Signed   By: Corean Salter M.D.   On: 09/11/2023 15:39   MS 3D DIAG MAMMO UNI LT BR (aka MM) Result Date: 03/02/2023 CLINICAL DATA:  LEFT trach all back EXAM: DIGITAL DIAGNOSTIC UNILATERAL LEFT MAMMOGRAM WITH TOMOSYNTHESIS AND CAD; ULTRASOUND LEFT BREAST LIMITED TECHNIQUE: Left digital diagnostic mammography and breast tomosynthesis was performed. The images were evaluated with computer-aided detection. ; Targeted ultrasound examination of the left breast was performed. COMPARISON:  Previous exam(s). ACR Breast Density Category c: The breasts are heterogeneously dense, which may obscure small masses. FINDINGS: Spot compression tomosynthesis views demonstrate effacement of the LEFT breast asymmetry on CC view. No additional suspicious findings are noted. On physical exam, no suspicious mass is appreciated. Targeted ultrasound was performed of the LEFT outer breast. At 2 o'clock 5 cm from the nipple, there is an oval circumscribed near anechoic mass with posterior acoustic enhancement. There are internal low level echoes. This measures 6  x 7 x 3 mm. Adjacent there is an area of favored benign tissue  with shadowing from a Cooper's ligament. IMPRESSION: 1. There is a probably benign mass with adjacent favored benign breast tissue in the LEFT breast. Option for short-term follow-up versus definitive characterization with ultrasound-guided aspiration with potential conversion to biopsy was discussed with patient. Patient would prefer to proceed with short-term follow-up at this point in time. As such, recommend follow-up diagnostic mammogram and ultrasound in 6 months. This will establish 6 months of definitive stability from baseline mammogram. RECOMMENDATION: LEFT diagnostic mammogram and ultrasound in 6 months. I have discussed the findings and recommendations with the patient with the assistance of a Spanish interpreter. If applicable, a reminder letter will be sent to the patient regarding the next appointment. BI-RADS CATEGORY  3: Probably benign. Electronically Signed   By: Corean Salter M.D.   On: 03/02/2023 16:23   MS 3D SCR MAMMO BILAT BR (aka MM) Result Date: 02/04/2023 CLINICAL DATA:  Screening. EXAM: DIGITAL SCREENING BILATERAL MAMMOGRAM WITH TOMOSYNTHESIS AND CAD TECHNIQUE: Bilateral screening digital craniocaudal and mediolateral oblique mammograms were obtained. Bilateral screening digital breast tomosynthesis was performed. The images were evaluated with computer-aided detection. COMPARISON:  None available. ACR Breast Density Category c: The breasts are heterogeneously dense, which may obscure small masses. FINDINGS: In the left breast, a possible asymmetry warrants further evaluation. In the right breast, no findings suspicious for malignancy. IMPRESSION: Further evaluation is suggested for possible asymmetry in the left breast. RECOMMENDATION: Diagnostic mammogram and possibly ultrasound of the left breast. (Code:[id number]-50M) The patient will be contacted regarding the findings, and additional imaging will be scheduled. BI-RADS CATEGORY  0: Incomplete: Need additional imaging evaluation.  Electronically Signed   By: Delon Music M.D.   On: 02/04/2023 12:24    Pelvic/Bimanual Pap is not indicated today per [facility] guidelines.   Smoking History: Patient has never smoked.   Patient Navigation: Patient education provided. Access to services provided for patient through [place] program. Spanish interpreter Bernice Angry from [facility] provided.   Colorectal Cancer Screening: Per patient has never had colonoscopy completed. Per patient completed a FIT Test given by PCP 01/31/2023 that was negative. FIT Test given to patient to complete. No complaints today.    Breast and Cervical Cancer Risk Assessment: Patient does not have family history of breast cancer, known genetic mutations, or radiation treatment to the chest before age 59. Patient does not have history of cervical dysplasia, immunocompromised, or DES exposure in-utero.  Risk Scores as of Encounter on 04/07/2024     [place] as of 02/02/2023           5-year 0.67%   Lifetime 7.9%   This patient is [place]/Latina but has no documented birth country, so the [place] model used data from [place] patients to calculate their risk score. Document a birth country in the Demographics activity for a more accurate score.         Last calculated by Rogerio Tempie SQUIBB, LPN on 89/85/7975 at 11:28 AM        A: [facility] exam without pap smear No complaints.  P: Referred patient to the [facility] for a diagnostic mammogram per recommendation. Appointment scheduled Tuesday, April 19, 2024 at 1000.  Driscilla Wanda SQUIBB, RN 04/07/2024 11:51 AM

## 2024-04-07 NOTE — Patient Instructions (Signed)
 Explained breast self awareness with Tanya Hartman. Patient did not need a Pap smear today due to last Pap smear and HPV typing was 05/27/2021. Let her know [facility] will cover Pap smears and HPV typing every 5 years unless has a history of abnormal Pap smears. Referred patient to the [facility] for a diagnostic mammogram per recommendation. Appointment scheduled Tuesday, April 19, 2024 at 1000. Patient aware of appointment and will be there. Tanya Hartman verbalized understanding.  [name], [name], RN 11:51 AM

## 2024-04-19 ENCOUNTER — Ambulatory Visit
Admission: RE | Admit: 2024-04-19 | Discharge: 2024-04-19 | Disposition: A | Discharge status: Home / Self Care | Payer: Self-pay | Source: Ambulatory Visit | Attending: Obstetrics and Gynecology | Admitting: Obstetrics and Gynecology

## 2024-04-19 DIAGNOSIS — R928 Other abnormal and inconclusive findings on diagnostic imaging of breast: Secondary | ICD-10-CM | POA: Insufficient documentation

## 2024-04-19 DIAGNOSIS — N6489 Other specified disorders of breast: Secondary | ICD-10-CM | POA: Insufficient documentation

## 2024-04-23 LAB — FECAL OCCULT BLOOD, IMMUNOCHEMICAL: Fecal Occult Bld: NEGATIVE
